# Patient Record
Sex: Male | Born: 1980 | Race: White | Hispanic: No | Marital: Married | Smoking: Current every day smoker
Health system: Southern US, Community
[De-identification: ages and names within clinical notes are randomized; demographics above are authoritative.]

## PROBLEM LIST (undated history)

## (undated) DIAGNOSIS — R112 Nausea with vomiting, unspecified: Secondary | ICD-10-CM

## (undated) DIAGNOSIS — J3081 Allergic rhinitis due to animal (cat) (dog) hair and dander: Secondary | ICD-10-CM

## (undated) DIAGNOSIS — T8859XA Other complications of anesthesia, initial encounter: Secondary | ICD-10-CM

## (undated) DIAGNOSIS — F419 Anxiety disorder, unspecified: Secondary | ICD-10-CM

## (undated) DIAGNOSIS — T4145XA Adverse effect of unspecified anesthetic, initial encounter: Secondary | ICD-10-CM

## (undated) DIAGNOSIS — K219 Gastro-esophageal reflux disease without esophagitis: Secondary | ICD-10-CM

## (undated) DIAGNOSIS — Z9889 Other specified postprocedural states: Secondary | ICD-10-CM

## (undated) HISTORY — PX: COLONOSCOPY: SHX174

## (undated) HISTORY — PX: HAND SURGERY: SHX662

---

## 2004-03-23 ENCOUNTER — Emergency Department (HOSPITAL_COMMUNITY): Admission: EM | Admit: 2004-03-23 | Discharge: 2004-03-23 | Payer: Self-pay | Admitting: Emergency Medicine

## 2004-03-24 ENCOUNTER — Emergency Department (HOSPITAL_COMMUNITY): Admission: EM | Admit: 2004-03-24 | Discharge: 2004-03-24 | Payer: Self-pay | Admitting: Emergency Medicine

## 2014-04-29 ENCOUNTER — Other Ambulatory Visit: Payer: Self-pay | Admitting: Gastroenterology

## 2014-04-29 DIAGNOSIS — R1011 Right upper quadrant pain: Secondary | ICD-10-CM

## 2014-04-29 DIAGNOSIS — R110 Nausea: Secondary | ICD-10-CM

## 2014-05-21 ENCOUNTER — Ambulatory Visit (HOSPITAL_COMMUNITY)
Admission: RE | Admit: 2014-05-21 | Discharge: 2014-05-21 | Disposition: A | Discharge status: Home / Self Care | Payer: BC Managed Care – PPO | Source: Ambulatory Visit | Attending: Gastroenterology | Admitting: Gastroenterology

## 2014-05-21 DIAGNOSIS — R1013 Epigastric pain: Secondary | ICD-10-CM | POA: Diagnosis present

## 2014-05-21 DIAGNOSIS — R161 Splenomegaly, not elsewhere classified: Secondary | ICD-10-CM | POA: Diagnosis not present

## 2014-05-21 DIAGNOSIS — R1011 Right upper quadrant pain: Secondary | ICD-10-CM

## 2014-05-21 DIAGNOSIS — R110 Nausea: Secondary | ICD-10-CM

## 2014-05-26 ENCOUNTER — Ambulatory Visit (HOSPITAL_COMMUNITY): Admission: RE | Admit: 2014-05-26 | Payer: BC Managed Care – PPO | Source: Ambulatory Visit

## 2014-06-01 ENCOUNTER — Ambulatory Visit (HOSPITAL_COMMUNITY)
Admission: RE | Admit: 2014-06-01 | Discharge: 2014-06-01 | Disposition: A | Discharge status: Home / Self Care | Payer: BC Managed Care – PPO | Source: Ambulatory Visit | Attending: Gastroenterology | Admitting: Gastroenterology

## 2014-06-01 DIAGNOSIS — R110 Nausea: Secondary | ICD-10-CM | POA: Diagnosis present

## 2014-06-01 DIAGNOSIS — R1011 Right upper quadrant pain: Secondary | ICD-10-CM | POA: Insufficient documentation

## 2014-06-01 MED ORDER — SINCALIDE 5 MCG IJ SOLR
INTRAMUSCULAR | Status: AC
Start: 2014-06-01 — End: 2014-06-02
  Filled 2014-06-01: qty 5

## 2014-06-01 MED ORDER — SINCALIDE 5 MCG IJ SOLR
0.0200 ug/kg | Freq: Once | INTRAMUSCULAR | Status: AC
  Administered 2014-06-01: 5 ug via INTRAVENOUS

## 2014-06-01 MED ORDER — STERILE WATER FOR INJECTION IJ SOLN
INTRAMUSCULAR | Status: AC
  Filled 2014-06-01: qty 10

## 2014-06-01 MED ORDER — TECHNETIUM TC 99M MEBROFENIN IV KIT
5.0000 | PACK | Freq: Once | INTRAVENOUS | Status: AC | PRN
Start: 2014-06-01 — End: 2014-06-01

## 2014-06-08 ENCOUNTER — Other Ambulatory Visit: Payer: Self-pay | Admitting: Gastroenterology

## 2014-06-08 DIAGNOSIS — R1084 Generalized abdominal pain: Secondary | ICD-10-CM

## 2014-06-10 ENCOUNTER — Ambulatory Visit
Admission: RE | Admit: 2014-06-10 | Discharge: 2014-06-10 | Disposition: A | Discharge status: Home / Self Care | Payer: BC Managed Care – PPO | Source: Ambulatory Visit | Attending: Gastroenterology | Admitting: Gastroenterology

## 2014-06-10 DIAGNOSIS — R1084 Generalized abdominal pain: Secondary | ICD-10-CM

## 2014-06-10 MED ORDER — IOHEXOL 300 MG/ML  SOLN
100.0000 mL | Freq: Once | INTRAMUSCULAR | Status: AC | PRN
  Administered 2014-06-10: 100 mL via INTRAVENOUS

## 2014-06-22 ENCOUNTER — Ambulatory Visit (INDEPENDENT_AMBULATORY_CARE_PROVIDER_SITE_OTHER): Payer: Self-pay | Admitting: General Surgery

## 2014-06-24 ENCOUNTER — Encounter (HOSPITAL_COMMUNITY): Payer: Self-pay | Admitting: *Deleted

## 2014-06-24 NOTE — Progress Notes (Addendum)
Mr Earl Wells reports has "chest pain" .  Chest pain is on the right side, just approx 1 inch found nipple line.  Patient also reports left shoulder pain, states he has been having this pain for about 2 months.  Mr Earl Wells also reports that he has had nausea and vomiting since Spring, "yellow with ? stones, little seeds".  Patient reports some shortness of breathe if pain is severe.  Pain is "like a pressure."Mr Earl Wells states normally pain begins after eating, but also has first thing in am. "Sometimes poain is relieved with Gas X and Xanax." Mr Earl Wells has seen his PCP, Dr Earl Wells, Dr Earl Wells and Dr Earl Wells. Patient has a normal HIDA scan.  Mr Earl Wells does not have any cardiac history, no HTN.  PGF has a MI in his 5070's and had cardiac stents, he died later from cancer.  Both parents have HTN. I am leaving chart for follow up with Earl CaprioAllison Z.

## 2014-06-28 MED ORDER — CIPROFLOXACIN IN D5W 400 MG/200ML IV SOLN
400.0000 mg | INTRAVENOUS | Status: AC
  Administered 2014-06-29: 400 mg via INTRAVENOUS
  Filled 2014-06-28: qty 200

## 2014-06-28 NOTE — Progress Notes (Signed)
Anesthesia Chart Review:  SAME DAY WORK-UP.  Patient is a 33 year old male scheduled for laparoscopic cholecystectomy tomorrow by Dr. Lindie SpruceWyatt.  Other history includes smoking, anxiety, GERD, post-operative N/V.  Family history of CAD in PGF in his 3270's. Weight not documented yet. PCP is Dr. Marjory LiesBrent Burnett.  He told his PAT phone interviewing RN that he had chest pain around his right nipple.    Meds include Xanax, albuterol, Dexilant.   He is a same day work-up, so plan further evaluation by his anesthesiologist and pre-operative EKG tomorrow on arrival.  Velna Ochsllison Nakul Avino, PA-C [facility] Phone [phone] 06/28/2014 10:25 AM

## 2014-06-29 ENCOUNTER — Ambulatory Visit (HOSPITAL_COMMUNITY)
Admission: RE | Admit: 2014-06-29 | Discharge: 2014-06-29 | Disposition: A | Discharge status: Home / Self Care | Payer: BC Managed Care – PPO | Source: Ambulatory Visit | Attending: General Surgery | Admitting: General Surgery

## 2014-06-29 ENCOUNTER — Ambulatory Visit (HOSPITAL_COMMUNITY): Payer: BC Managed Care – PPO | Admitting: Vascular Surgery

## 2014-06-29 ENCOUNTER — Encounter (HOSPITAL_COMMUNITY)
Admission: RE | Disposition: A | Discharge status: Home / Self Care | Payer: Self-pay | Source: Ambulatory Visit | Attending: General Surgery

## 2014-06-29 ENCOUNTER — Encounter (HOSPITAL_COMMUNITY): Payer: Self-pay | Admitting: Anesthesiology

## 2014-06-29 DIAGNOSIS — R001 Bradycardia, unspecified: Secondary | ICD-10-CM | POA: Insufficient documentation

## 2014-06-29 DIAGNOSIS — K219 Gastro-esophageal reflux disease without esophagitis: Secondary | ICD-10-CM | POA: Insufficient documentation

## 2014-06-29 DIAGNOSIS — K811 Chronic cholecystitis: Secondary | ICD-10-CM | POA: Diagnosis not present

## 2014-06-29 DIAGNOSIS — Z79899 Other long term (current) drug therapy: Secondary | ICD-10-CM | POA: Insufficient documentation

## 2014-06-29 DIAGNOSIS — F17200 Nicotine dependence, unspecified, uncomplicated: Secondary | ICD-10-CM | POA: Diagnosis not present

## 2014-06-29 DIAGNOSIS — K829 Disease of gallbladder, unspecified: Secondary | ICD-10-CM | POA: Diagnosis present

## 2014-06-29 HISTORY — DX: Anxiety disorder, unspecified: F41.9

## 2014-06-29 HISTORY — DX: Gastro-esophageal reflux disease without esophagitis: K21.9

## 2014-06-29 HISTORY — DX: Other complications of anesthesia, initial encounter: T88.59XA

## 2014-06-29 HISTORY — DX: Allergic rhinitis due to animal (cat) (dog) hair and dander: J30.81

## 2014-06-29 HISTORY — PX: CHOLECYSTECTOMY: SHX55

## 2014-06-29 HISTORY — DX: Adverse effect of unspecified anesthetic, initial encounter: T41.45XA

## 2014-06-29 HISTORY — DX: Other specified postprocedural states: Z98.890

## 2014-06-29 HISTORY — DX: Nausea with vomiting, unspecified: R11.2

## 2014-06-29 LAB — COMPREHENSIVE METABOLIC PANEL
ALBUMIN: 4.4 g/dL (ref 3.5–5.2)
ALT: 24 U/L (ref 0–53)
AST: 29 U/L (ref 0–37)
Alkaline Phosphatase: 72 U/L (ref 39–117)
Anion gap: 8 (ref 5–15)
BUN: 9 mg/dL (ref 6–23)
CO2: 27 mmol/L (ref 19–32)
Calcium: 9 mg/dL (ref 8.4–10.5)
Chloride: 105 mEq/L (ref 96–112)
Creatinine, Ser: 0.89 mg/dL (ref 0.50–1.35)
GFR calc Af Amer: >90 mL/min (ref >90)
GFR calc non Af Amer: >90 mL/min (ref >90)
Glucose, Bld: 106 mg/dL — ABNORMAL HIGH (ref 70–99)
POTASSIUM: 4 mmol/L (ref 3.5–5.1)
Sodium: 140 mmol/L (ref 135–145)
Total Bilirubin: 0.9 mg/dL (ref 0.3–1.2)
Total Protein: 6.6 g/dL (ref 6.0–8.3)

## 2014-06-29 LAB — CBC WITH DIFFERENTIAL/PLATELET
BASOS PCT: 0 % (ref 0–1)
Basophils Absolute: 0 10*3/uL (ref 0.0–0.1)
Eosinophils Absolute: 0.2 10*3/uL (ref 0.0–0.7)
Eosinophils Relative: 2 % (ref 0–5)
HCT: 45.5 % (ref 39.0–52.0)
HEMOGLOBIN: 15.6 g/dL (ref 13.0–17.0)
Lymphocytes Relative: 38 % (ref 12–46)
Lymphs Abs: 3.4 10*3/uL (ref 0.7–4.0)
MCH: 28.7 pg (ref 26.0–34.0)
MCHC: 34.3 g/dL (ref 30.0–36.0)
MCV: 83.8 fL (ref 78.0–100.0)
Monocytes Absolute: 0.5 10*3/uL (ref 0.1–1.0)
Monocytes Relative: 5 % (ref 3–12)
NEUTROS ABS: 4.9 10*3/uL (ref 1.7–7.7)
NEUTROS PCT: 55 % (ref 43–77)
Platelets: 186 10*3/uL (ref 150–400)
RBC: 5.43 MIL/uL (ref 4.22–5.81)
RDW: 13.1 % (ref 11.5–15.5)
WBC: 9 10*3/uL (ref 4.0–10.5)

## 2014-06-29 SURGERY — LAPAROSCOPIC CHOLECYSTECTOMY WITH INTRAOPERATIVE CHOLANGIOGRAM
Anesthesia: General | Site: Abdomen

## 2014-06-29 MED ORDER — NEOSTIGMINE METHYLSULFATE 10 MG/10ML IV SOLN
INTRAVENOUS | Status: DC | PRN
  Administered 2014-06-29: 3 mg via INTRAVENOUS

## 2014-06-29 MED ORDER — DEXAMETHASONE SODIUM PHOSPHATE 4 MG/ML IJ SOLN
INTRAMUSCULAR | Status: DC | PRN
  Administered 2014-06-29: 8 mg via INTRAVENOUS

## 2014-06-29 MED ORDER — ONDANSETRON HCL 4 MG/2ML IJ SOLN
INTRAMUSCULAR | Status: AC
  Filled 2014-06-29: qty 2

## 2014-06-29 MED ORDER — LACTATED RINGERS IV SOLN
INTRAVENOUS | Status: DC
  Administered 2014-06-29 (×2): via INTRAVENOUS

## 2014-06-29 MED ORDER — VECURONIUM BROMIDE 10 MG IV SOLR
INTRAVENOUS | Status: AC
  Filled 2014-06-29: qty 20

## 2014-06-29 MED ORDER — GLYCOPYRROLATE 0.2 MG/ML IJ SOLN
INTRAMUSCULAR | Status: AC
  Filled 2014-06-29: qty 2

## 2014-06-29 MED ORDER — HYDROCODONE-ACETAMINOPHEN 10-325 MG PO TABS: 1.0000 | ORAL_TABLET | Freq: Four times a day (QID) | ORAL | Status: AC | PRN

## 2014-06-29 MED ORDER — FENTANYL CITRATE 0.05 MG/ML IJ SOLN
INTRAMUSCULAR | Status: DC | PRN
  Administered 2014-06-29 (×2): 50 ug via INTRAVENOUS
  Administered 2014-06-29: 100 ug via INTRAVENOUS

## 2014-06-29 MED ORDER — 0.9 % SODIUM CHLORIDE (POUR BTL) OPTIME
TOPICAL | Status: DC | PRN
  Administered 2014-06-29: 1000 mL

## 2014-06-29 MED ORDER — CHLORHEXIDINE GLUCONATE 4 % EX LIQD: 1.0000 "application " | Freq: Once | CUTANEOUS | Status: DC

## 2014-06-29 MED ORDER — MIDAZOLAM HCL 2 MG/2ML IJ SOLN
INTRAMUSCULAR | Status: AC
  Filled 2014-06-29: qty 2

## 2014-06-29 MED ORDER — VECURONIUM BROMIDE 10 MG IV SOLR
INTRAVENOUS | Status: AC
  Filled 2014-06-29: qty 10

## 2014-06-29 MED ORDER — NEOSTIGMINE METHYLSULFATE 10 MG/10ML IV SOLN
INTRAVENOUS | Status: AC
  Filled 2014-06-29: qty 1

## 2014-06-29 MED ORDER — GLYCOPYRROLATE 0.2 MG/ML IJ SOLN
INTRAMUSCULAR | Status: DC | PRN
  Administered 2014-06-29: 0.2 mg via INTRAVENOUS
  Administered 2014-06-29: 0.4 mg via INTRAVENOUS
  Administered 2014-06-29: 0.2 mg via INTRAVENOUS

## 2014-06-29 MED ORDER — BUPIVACAINE-EPINEPHRINE 0.25% -1:200000 IJ SOLN
INTRAMUSCULAR | Status: DC | PRN
  Administered 2014-06-29: 15 mL

## 2014-06-29 MED ORDER — SODIUM CHLORIDE 0.9 % IJ SOLN
INTRAMUSCULAR | Status: AC
  Filled 2014-06-29: qty 10

## 2014-06-29 MED ORDER — SCOPOLAMINE 1 MG/3DAYS TD PT72
1.0000 | MEDICATED_PATCH | TRANSDERMAL | Status: DC
  Administered 2014-06-29: 1.5 mg via TRANSDERMAL
  Filled 2014-06-29: qty 1

## 2014-06-29 MED ORDER — BUPIVACAINE-EPINEPHRINE (PF) 0.25% -1:200000 IJ SOLN
INTRAMUSCULAR | Status: AC
  Filled 2014-06-29: qty 30

## 2014-06-29 MED ORDER — ROCURONIUM BROMIDE 50 MG/5ML IV SOLN
INTRAVENOUS | Status: AC
  Filled 2014-06-29: qty 1

## 2014-06-29 MED ORDER — PROPOFOL 10 MG/ML IV BOLUS
INTRAVENOUS | Status: DC | PRN
  Administered 2014-06-29: 170 mg via INTRAVENOUS

## 2014-06-29 MED ORDER — LIDOCAINE HCL (CARDIAC) 20 MG/ML IV SOLN
INTRAVENOUS | Status: AC
  Filled 2014-06-29: qty 5

## 2014-06-29 MED ORDER — FENTANYL CITRATE 0.05 MG/ML IJ SOLN
INTRAMUSCULAR | Status: AC
  Administered 2014-06-29: 25 ug via INTRAVENOUS
  Filled 2014-06-29: qty 2

## 2014-06-29 MED ORDER — MEPERIDINE HCL 25 MG/ML IJ SOLN: 6.2500 mg | INTRAMUSCULAR | Status: DC | PRN

## 2014-06-29 MED ORDER — SODIUM CHLORIDE 0.9 % IR SOLN
Status: DC | PRN
  Administered 2014-06-29: 1

## 2014-06-29 MED ORDER — FENTANYL CITRATE 0.05 MG/ML IJ SOLN
25.0000 ug | INTRAMUSCULAR | Status: DC | PRN
  Administered 2014-06-29: 25 ug via INTRAVENOUS
  Administered 2014-06-29: 50 ug via INTRAVENOUS
  Administered 2014-06-29: 25 ug via INTRAVENOUS

## 2014-06-29 MED ORDER — STERILE WATER FOR INJECTION IJ SOLN
INTRAMUSCULAR | Status: AC
  Filled 2014-06-29: qty 20

## 2014-06-29 MED ORDER — ROCURONIUM BROMIDE 100 MG/10ML IV SOLN
INTRAVENOUS | Status: DC | PRN
  Administered 2014-06-29: 40 mg via INTRAVENOUS

## 2014-06-29 MED ORDER — GLYCOPYRROLATE 0.2 MG/ML IJ SOLN
INTRAMUSCULAR | Status: AC
  Filled 2014-06-29: qty 1

## 2014-06-29 MED ORDER — STERILE WATER FOR INJECTION IJ SOLN
INTRAMUSCULAR | Status: AC
  Filled 2014-06-29: qty 10

## 2014-06-29 MED ORDER — MIDAZOLAM HCL 5 MG/5ML IJ SOLN
INTRAMUSCULAR | Status: DC | PRN
  Administered 2014-06-29: 2 mg via INTRAVENOUS

## 2014-06-29 MED ORDER — LIDOCAINE HCL (CARDIAC) 20 MG/ML IV SOLN
INTRAVENOUS | Status: DC | PRN
  Administered 2014-06-29: 100 mg via INTRAVENOUS

## 2014-06-29 MED ORDER — FENTANYL CITRATE 0.05 MG/ML IJ SOLN
INTRAMUSCULAR | Status: AC
  Filled 2014-06-29: qty 5

## 2014-06-29 MED ORDER — PROPOFOL 10 MG/ML IV BOLUS
INTRAVENOUS | Status: AC
  Filled 2014-06-29: qty 20

## 2014-06-29 MED ORDER — ONDANSETRON HCL 4 MG/2ML IJ SOLN
INTRAMUSCULAR | Status: DC | PRN
  Administered 2014-06-29: 4 mg via INTRAVENOUS

## 2014-06-29 MED ORDER — PROMETHAZINE HCL 25 MG/ML IJ SOLN: 6.2500 mg | INTRAMUSCULAR | Status: DC | PRN

## 2014-06-29 MED ORDER — EPHEDRINE SULFATE 50 MG/ML IJ SOLN
INTRAMUSCULAR | Status: AC
  Filled 2014-06-29: qty 1

## 2014-06-29 SURGICAL SUPPLY — 41 items
APPLIER CLIP 5 13 M/L LIGAMAX5 (MISCELLANEOUS)
APPLIER CLIP ROT 10 11.4 M/L (STAPLE)
APR CLP MED LRG 11.4X10 (STAPLE)
APR CLP MED LRG 5 ANG JAW (MISCELLANEOUS)
BAG SPEC RTRVL LRG 6X4 10 (ENDOMECHANICALS)
BLADE SURG ROTATE 9660 (MISCELLANEOUS) IMPLANT
CANISTER SUCTION 2500CC (MISCELLANEOUS) ×2 IMPLANT
CHLORAPREP W/TINT 26ML (MISCELLANEOUS) ×2 IMPLANT
CLIP APPLIE 5 13 M/L LIGAMAX5 (MISCELLANEOUS) IMPLANT
CLIP APPLIE ROT 10 11.4 M/L (STAPLE) IMPLANT
COVER MAYO STAND STRL (DRAPES) ×2 IMPLANT
COVER SURGICAL LIGHT HANDLE (MISCELLANEOUS) ×2 IMPLANT
DRAPE C-ARM 42X72 X-RAY (DRAPES) ×2 IMPLANT
DRAPE LAPAROSCOPIC ABDOMINAL (DRAPES) ×2 IMPLANT
DRSG TEGADERM 2-3/8X2-3/4 SM (GAUZE/BANDAGES/DRESSINGS) ×8 IMPLANT
ELECT REM PT RETURN 9FT ADLT (ELECTROSURGICAL) ×2
ELECTRODE REM PT RTRN 9FT ADLT (ELECTROSURGICAL) ×1 IMPLANT
GLOVE BIOGEL PI IND STRL 8 (GLOVE) ×1 IMPLANT
GLOVE BIOGEL PI INDICATOR 8 (GLOVE) ×1
GLOVE ECLIPSE 7.5 STRL STRAW (GLOVE) ×2 IMPLANT
GOWN STRL REUS W/ TWL LRG LVL3 (GOWN DISPOSABLE) ×3 IMPLANT
GOWN STRL REUS W/TWL LRG LVL3 (GOWN DISPOSABLE) ×6
KIT BASIN OR (CUSTOM PROCEDURE TRAY) ×2 IMPLANT
KIT ROOM TURNOVER OR (KITS) ×2 IMPLANT
LIQUID BAND (GAUZE/BANDAGES/DRESSINGS) ×2 IMPLANT
NS IRRIG 1000ML POUR BTL (IV SOLUTION) ×2 IMPLANT
PAD ARMBOARD 7.5X6 YLW CONV (MISCELLANEOUS) ×2 IMPLANT
POUCH SPECIMEN RETRIEVAL 10MM (ENDOMECHANICALS) IMPLANT
SCISSORS LAP 5X35 DISP (ENDOMECHANICALS) ×2 IMPLANT
SET CHOLANGIOGRAPH 5 50 .035 (SET/KITS/TRAYS/PACK) ×2 IMPLANT
SET IRRIG TUBING LAPAROSCOPIC (IRRIGATION / IRRIGATOR) ×2 IMPLANT
SLEEVE ENDOPATH XCEL 5M (ENDOMECHANICALS) ×2 IMPLANT
SPECIMEN JAR SMALL (MISCELLANEOUS) ×2 IMPLANT
SUT MNCRL AB 4-0 PS2 18 (SUTURE) ×2 IMPLANT
TOWEL OR 17X24 6PK STRL BLUE (TOWEL DISPOSABLE) ×2 IMPLANT
TOWEL OR 17X26 10 PK STRL BLUE (TOWEL DISPOSABLE) ×2 IMPLANT
TRAY LAPAROSCOPIC (CUSTOM PROCEDURE TRAY) ×2 IMPLANT
TROCAR XCEL BLUNT TIP 100MML (ENDOMECHANICALS) ×2 IMPLANT
TROCAR XCEL NON-BLD 11X100MML (ENDOMECHANICALS) IMPLANT
TROCAR XCEL NON-BLD 5MMX100MML (ENDOMECHANICALS) ×2 IMPLANT
TUBING INSUFFLATION (TUBING) ×2 IMPLANT

## 2014-06-29 NOTE — Discharge Instructions (Addendum)
Laparoscopic Cholecystectomy, Care After Refer to this sheet in the next few weeks. These instructions provide you with information on caring for yourself after your procedure. Your health care provider may also give you more specific instructions. Your treatment has been planned according to current medical practices, but problems sometimes occur. Call your health care provider if you have any problems or questions after your procedure. WHAT TO EXPECT AFTER THE PROCEDURE After your procedure, it is typical to have the following:  Pain at your incision sites. You will be given pain medicines to control the pain.  Mild nausea or vomiting. This should improve after the first 24 hours.  Bloating and possibly shoulder pain from the gas used during the procedure. This will improve after the first 24 hours. HOME CARE INSTRUCTIONS   Change bandages (dressings) as directed by your health care provider.  Keep the wound dry and clean. You may wash the wound gently with soap and water. Gently blot or dab the area dry.  Do not take baths or use swimming pools or hot tubs for 2 weeks or until your health care provider approves.  Only take over-the-counter or prescription medicines as directed by your health care provider.  Continue your normal diet as directed by your health care provider.  Do not lift anything heavier than 10 pounds (4.5 kg) until your health care provider approves.  Do not play contact sports for 1 week or until your health care provider approves.  Leave plastic dressings intact until see in clinic SEEK MEDICAL CARE IF:   You have redness, swelling, or increasing pain in the wound.  You notice yellowish-white fluid (pus) coming from the wound.  You have drainage from the wound that lasts longer than 1 day.  You notice a bad smell coming from the wound or dressing.  Your surgical cuts (incisions) break open. SEEK IMMEDIATE MEDICAL CARE IF:   You develop a rash.  You  have difficulty breathing.  You have chest pain.  You have a fever.  You have increasing pain in the shoulders (shoulder strap areas).  You have dizzy episodes or faint while standing.  You have severe abdominal pain.  You feel sick to your stomach (nauseous) or throw up (vomit) and this lasts for more than 1 day. Document Released: 06/18/2005 Document Revised: 04/08/2013 Document Reviewed: 01/28/2013 [facility] Patient Information 2015 [place], [place]. This information is not intended to replace advice given to you by your health care provider. Make sure you discuss any questions you have with your health care provider.   What to eat:  For your first meals, you should eat lightly; only small meals initially.  If you do not have nausea, you may eat larger meals.  Avoid spicy, greasy and heavy food.    General Anesthesia, Adult, Care After  Refer to this sheet in the next few weeks. These instructions provide you with information on caring for yourself after your procedure. Your health care provider may also give you more specific instructions. Your treatment has been planned according to current medical practices, but problems sometimes occur. Call your health care provider if you have any problems or questions after your procedure.  WHAT TO EXPECT AFTER THE PROCEDURE  After the procedure, it is typical to experience:  Sleepiness.  Nausea and vomiting. HOME CARE INSTRUCTIONS  For the first 24 hours after general anesthesia:  Have a responsible person with you.  Do not drive a car. If you are alone, do not take public transportation.  Do not drink alcohol.  Do not take medicine that has not been prescribed by your health care provider.  Do not sign important papers or make important decisions.  You may resume a normal diet and activities as directed by your health care provider.  Change bandages (dressings) as directed.  If you have questions or problems that seem related to general  anesthesia, call the hospital and ask for the anesthetist or anesthesiologist on call. SEEK MEDICAL CARE IF:  You have nausea and vomiting that continue the day after anesthesia.  You develop a rash. SEEK IMMEDIATE MEDICAL CARE IF:  You have difficulty breathing.  You have chest pain.  You have any allergic problems. Document Released: 09/24/2000 Document Revised: 02/18/2013 Document Reviewed: 01/01/2013  [facility] Patient Information 2014 [place], [place].

## 2014-06-29 NOTE — Addendum Note (Signed)
Addendum  created 06/29/14 1801 by Shireen Quanavid R Riaz Onorato, CRNA   Modules edited: Anesthesia Events, Narrator   Narrator:  Narrator: Event Log Edited

## 2014-06-29 NOTE — Anesthesia Procedure Notes (Signed)
Procedure Name: Intubation Date/Time: 06/29/2014 1:48 PM Performed by: Jerilee HohMUMM, Yeng Frankie N Pre-anesthesia Checklist: Patient identified, Emergency Drugs available, Patient being monitored and Suction available Patient Re-evaluated:Patient Re-evaluated prior to inductionOxygen Delivery Method: Circle system utilized Preoxygenation: Pre-oxygenation with 100% oxygen Intubation Type: IV induction Ventilation: Mask ventilation without difficulty Laryngoscope Size: Mac and 4 Grade View: Grade I Tube type: Oral Tube size: 7.5 mm Number of attempts: 1 Airway Equipment and Method: Stylet Placement Confirmation: ETT inserted through vocal cords under direct vision,  positive ETCO2 and breath sounds checked- equal and bilateral Secured at: 22 cm Tube secured with: Tape Dental Injury: Teeth and Oropharynx as per pre-operative assessment

## 2014-06-29 NOTE — Anesthesia Postprocedure Evaluation (Signed)
  Anesthesia Post-op Note  Patient: Earl Wells  Procedure(s) Performed: Procedure(s): LAPAROSCOPIC CHOLECYSTECTOMY  (N/A)  Patient Location: PACU  Anesthesia Type:General  Level of Consciousness: awake and alert   Airway and Oxygen Therapy: Patient Spontanous Breathing  Post-op Pain: mild  Post-op Assessment: Post-op Vital signs reviewed, Patient's Cardiovascular Status Stable, Respiratory Function Stable, Patent Airway, No signs of Nausea or vomiting and Pain level controlled  Post-op Vital Signs: Reviewed and stable  Last Vitals:  Filed Vitals:   06/29/14 1615  BP:   Pulse: 45  Temp:   Resp:     Complications: No apparent anesthesia complications

## 2014-06-29 NOTE — H&P (Signed)
Earl Wells 06/22/2014 2:10 PM Location: [facility] Patient #: [id number] DOB: 04/24/1981 Married / Language: English / Race: White Male  History of Present Illness Earl Wells(Adelise Buswell O. Lindie SpruceWyatt MD; 06/22/2014 2:48 PM) Patient words: GB pain.  The patient is a 33 year old male who presents with non-malignant abdominal pain. Symptoms include abdominal pain, diarrhea, nausea and vomiting. The patient describes the pain as sharp, aching and shooting. Onset was 60 month(s) ago. Pain exacerbations occur 3 times a day.   Other Problems Earl Wells(Michele Daniels, CMA; 06/22/2014 2:10 PM) Gastroesophageal Reflux Disease Ulcerative Colitis  Past Surgical History Earl Wells(Michele Daniels, CMA; 06/22/2014 2:10 PM) Colon Polyp Removal - Colonoscopy  Diagnostic Studies History Earl Wells(Michele Daniels, CMA; 06/22/2014 2:10 PM) Colonoscopy within last year  Allergies Earl Wells(Michele Daniels, CMA; 06/22/2014 2:11 PM) No Known Drug Allergies12/22/2015  Medication History Earl Wells(Michele Daniels, CMA; 06/22/2014 2:13 PM) Dexilant (60MG  Capsule DR, Oral) Active. Xanax (0.5MG  Tablet, Oral) Active.  Social History Earl Wells(Michele Daniels, [place]; 06/22/2014 2:10 PM) Alcohol use Remotely quit alcohol use. Caffeine use Carbonated beverages, Tea. Tobacco use Current some day smoker.  Family History Earl Wells(Michele Daniels, CMA; 06/22/2014 2:10 PM) Colon Polyps Father, Mother. Diabetes Mellitus Father. Hypertension Father, Mother.  Review of Systems Earl Wells(Townes Fuhs O. Lindie SpruceWyatt MD; 06/22/2014 2:45 PM) General Not Present- Appetite Loss, Chills, Fatigue, Fever, Night Sweats, Weight Gain and Weight Loss. Skin Not Present- Change in Wart/Mole, Dryness, Hives, Jaundice, New Lesions, Non-Healing Wounds, Rash and Ulcer. HEENT Not Present- Earache, Hearing Loss, Hoarseness, Nose Bleed, Oral Ulcers, Ringing in the Ears, Seasonal Allergies, Sinus Pain, Sore Throat, Visual Disturbances, Wears glasses/contact lenses and Yellow Eyes. Respiratory Present-  Wheezing. Not Present- Bloody sputum, Chronic Cough, Difficulty Breathing and Snoring. Breast Not Present- Breast Mass, Breast Pain, Nipple Discharge and Skin Changes. Cardiovascular Present- Chest Pain. Not Present- Difficulty Breathing Lying Down, Leg Cramps, Palpitations, Rapid Heart Rate, Shortness of Breath and Swelling of Extremities. Gastrointestinal Present- Abdominal Pain, Bloating, Change in Bowel Habits, Chronic diarrhea, Excessive gas, Gets full quickly at meals, Heartburn and Vomiting. Not Present- Bloody Stool, Constipation, Difficulty Swallowing, Hemorrhoids, Indigestion, Nausea and Rectal Pain. Male Genitourinary Not Present- Blood in Urine, Change in Urinary Stream, Frequency, Impotence, Nocturia, Painful Urination, Urgency and Urine Leakage.   Vitals Earl Wells(Michele Daniels CMA; 06/22/2014 2:14 PM) 06/22/2014 2:13 PM Weight: 166.5 lb Height: 69in Body Surface Area: 1.92 m Body Mass Index: 24.59 kg/m Temp.: 98.18F  Pulse: 76 (Regular)  BP: 140/68 (Sitting, Left Arm, Standard)    Physical Exam (Rayan Ines O. Lindie SpruceWyatt MD; 06/22/2014 2:52 PM) General Mental Status-Alert. General Appearance-Cooperative. Build & Nutrition-Muscular. Posture-Normal posture.  Chest and Lung Exam Chest and lung exam reveals -normal excursion with symmetric chest walls. Auscultation Breath sounds - Normal and Clear.  Cardiovascular Auscultation Rhythm - Regular. Heart Sounds - Normal heart sounds. Murmurs & Other Heart Sounds - Auscultation of the heart reveals - No Murmurs.  Abdomen Inspection Inspection of the abdomen reveals - No Visible peristalsis and No Hernias. Palpation/Percussion Tenderness - Left Upper Quadrant and Right Upper Quadrant. Auscultation Auscultation of the abdomen reveals - Bowel sounds normal.  Neurologic Neurologic evaluation reveals -alert and oriented x 3 with no impairment of recent or remote memory, normal attention span and ability to  concentrate and upper and lower extremity deep tendon reflexes intact bilaterally (Normal).  Lymphatic Note: Normal cervical and axillary and inguinal palpation     Assessment & Plan Fayrene Fearing(Tam Savoia O. 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

## 2014-06-29 NOTE — Anesthesia Preprocedure Evaluation (Signed)
Anesthesia Evaluation  Patient identified by MRN, date of birth, ID band Patient awake    Reviewed: Allergy & Precautions, H&P , NPO status , Patient's Chart, lab work & pertinent test results, reviewed documented beta blocker date and time   History of Anesthesia Complications (+) PONV  Airway        Dental   Pulmonary Current Smoker (15 pack year),          Cardiovascular     Neuro/Psych Anxiety    GI/Hepatic GERD-  ,  Endo/Other    Renal/GU      Musculoskeletal   Abdominal   Peds  Hematology   Anesthesia Other Findings   Reproductive/Obstetrics                             Anesthesia Physical Anesthesia Plan  ASA: II  Anesthesia Plan: General   Post-op Pain Management:    Induction: Intravenous  Airway Management Planned: Oral ETT  Additional Equipment:   Intra-op Plan:   Post-operative Plan: Extubation in OR  Informed Consent: I have reviewed the patients History and Physical, chart, labs and discussed the procedure including the risks, benefits and alternatives for the proposed anesthesia with the patient or authorized representative who has indicated his/her understanding and acceptance.     Plan Discussed with:   Anesthesia Plan Comments: (Need to check this am labs ordered)        Anesthesia Quick Evaluation

## 2014-06-29 NOTE — Transfer of Care (Signed)
Immediate Anesthesia Transfer of Care Note  Patient: Earl Wells  Procedure(s) Performed: Procedure(s): LAPAROSCOPIC CHOLECYSTECTOMY  (N/A)  Patient Location: PACU  Anesthesia Type:General  Level of Consciousness: awake, alert , oriented and patient cooperative  Airway & Oxygen Therapy: Patient Spontanous Breathing and Patient connected to nasal cannula oxygen  Post-op Assessment: Report given to PACU RN, Post -op Vital signs reviewed and stable and Patient moving all extremities  Post vital signs: Reviewed and stable  Complications: No apparent anesthesia complications

## 2014-06-29 NOTE — Op Note (Signed)
OPERATIVE REPORT  DATE OF OPERATION: 06/29/2014  PATIENT:  Earl Wells  33 y.o. male  PRE-OPERATIVE DIAGNOSIS:  symptomatic gallbadder sludge  POST-OPERATIVE DIAGNOSIS:  symptomatic gallbadder sludge  PROCEDURE:  Procedure(s): LAPAROSCOPIC CHOLECYSTECTOMY   SURGEON:  Surgeon(s): Frederik SchmidtJay Yurika Pereda, MD  ASSISTANT: None  ANESTHESIA:   general  EBL: <20 ml  BLOOD ADMINISTERED: none  DRAINS: none   SPECIMEN:  Source of Specimen:  Gallbladder and contents  COUNTS CORRECT:  YES  PROCEDURE DETAILS: The patient was taken to the operating room and placed on the table in the supine position.  After an adequate endotracheal anesthetic was administered, the patient was prepped with ChloroPrep, and then draped in the usual manner exposing the entire abdomen laterally, inferiorly and up  to the costal margins.  After a proper timeout was performed including identifying the patient and the procedure to be performed, a supraumbilical 1.5cm midline incision was made using a #15 blade.  This was taken down to the fascia which was then incised with a #15 blade.  The edges of the fascia were tented up with Kocher clamps as the preperitoneal space was penetrated with a Kelly clamp into the peritoneum.  Once this was done, a pursestring suture of 0 Vicryl was passed around the fascial opening.  This was subsequently used to secure the [facility] cannula which was passed into the peritoneal cavity.  Once the [facility] cannula was in place, carbon dioxide gas was insufflated into the peritoneal cavity up to a maximal intra-abdominal pressure of 15mm Hg.The laparoscope, with attached camera and light source, was passed into the peritoneal cavity to visualize the direct insertion of two right upper quadrant 5mm cannulas, and a sup-xiphoid 5mm cannula.  Once all cannulas were in place, the dissection was begun.  Two ratcheted graspers were attached to the dome and infundibulum of the gallbladder and retracted towards  the anterior abdominal wall and the right upper quadrant.  Using cautery attached to a dissecting forceps, the peritoneum overlaying the triangle of Chalot and the hepatoduodenal triangle was dissected away exposing the cystic duct and the cystic artery.  The cystic artery was clipped proximally and distally then transected.  A clip was placed on the gallbladder side of the cystic duct, then the distal cystic duct was clipped multiple times then transected.  The gallbladder was then dissected out of the hepatic bed without event.  It was retrieved from the abdomen (using an EndoCatch bag) without event.  Once the gallbladder was removed, the bed was inspected for hemostasis.  Once excellent hemostasis was obtained all gas and fluids were aspirated from above the liver, then the cannulas were removed.  The supraumbilical incision was closed using the pursestring suture which was in place.  0.25% bupivicaine with epinephrine was injected at all sites.  All 10mm or greater cannula sites were close using a running subcuticular stitch of 4-0 Monocryl.  5.750mm cannula sites were closed with Dermabond only.Steri-Strips and Tagaderm were used to complete the dressings at all sites.  At this point all needle, sponge, and instrument counts were correct.The patient was awakened from anesthesia and taken to the PACU in stable condition.  PATIENT DISPOSITION:  PACU - hemodynamically stable.   [name], JAY 12/29/20152:40 PM

## 2014-06-29 NOTE — Interval H&P Note (Signed)
History and Physical Interval Note: Patient has not been very symptomatic since his visit.  Although he has normal HIDA, sludge on US and symptoms suggest chronic disease. Marta LamasJames O. Gae BonWyatt, III, MD, FACS [phone]--pager [phone]--office [facility] 06/29/2014 1:01 PM  Jacki Conesaniel L Eades  has presented today for surgery, with the diagnosis of symptomatic gallbadder sludge  The various methods of treatment have been discussed with the patient and family. After consideration of risks, benefits and other options for treatment, the patient has consented to  Procedure(s): LAPAROSCOPIC CHOLECYSTECTOMY WITH INTRAOPERATIVE CHOLANGIOGRAM (N/A) as a surgical intervention .  The patient's history has been reviewed, patient examined, no change in status, stable for surgery.  I have reviewed the patient's chart and labs.  Questions were answered to the patient's satisfaction.     Manvir Prabhu, JAY

## 2014-06-30 ENCOUNTER — Encounter (HOSPITAL_COMMUNITY): Payer: Self-pay | Admitting: General Surgery

## 2014-10-28 ENCOUNTER — Telehealth: Payer: Self-pay | Admitting: Family Medicine

## 2014-10-28 NOTE — Telephone Encounter (Signed)
error:[id number] ° °

## 2015-01-07 ENCOUNTER — Other Ambulatory Visit: Payer: Self-pay | Admitting: Gastroenterology

## 2015-01-07 DIAGNOSIS — R1111 Vomiting without nausea: Secondary | ICD-10-CM

## 2015-01-17 ENCOUNTER — Ambulatory Visit (HOSPITAL_COMMUNITY)
Admission: RE | Admit: 2015-01-17 | Discharge: 2015-01-17 | Disposition: A | Discharge status: Home / Self Care | Payer: Self-pay | Source: Ambulatory Visit | Attending: Gastroenterology | Admitting: Gastroenterology

## 2015-01-17 DIAGNOSIS — R1111 Vomiting without nausea: Secondary | ICD-10-CM

## 2015-01-17 DIAGNOSIS — R112 Nausea with vomiting, unspecified: Secondary | ICD-10-CM | POA: Insufficient documentation

## 2015-01-17 DIAGNOSIS — R109 Unspecified abdominal pain: Secondary | ICD-10-CM | POA: Insufficient documentation

## 2015-01-17 DIAGNOSIS — R140 Abdominal distension (gaseous): Secondary | ICD-10-CM | POA: Insufficient documentation

## 2015-01-17 MED ORDER — TECHNETIUM TC 99M SULFUR COLLOID
2.0000 | Freq: Once | INTRAVENOUS | Status: AC | PRN
  Administered 2015-01-17: 2 via ORAL

## 2016-08-22 IMAGING — CT CT ENTEROGRAPHY (ABD-PELV W/ CM)
2 of 6 series · 17 of 46 positions shown, 19 images · IV contrast ([ID] VOLUMEN & [ID] OMNI 300)
Comparison: 03/23/2004

CLINICAL DATA: Right upper quadrant abdominal pain for 6 months.
Previous history small bowel obstruction.

EXAM:
CT ABDOMEN AND PELVIS WITH CONTRAST (ENTEROGRAPHY)
TECHNIQUE: Multidetector CT of the abdomen and pelvis during bolus
administration of intravenous contrast. Negative oral contrast
VoLumen was given.
CONTRAST:  100mL OMNIPAQUE IOHEXOL 300 MG/ML  SOLN

[Series 2: enterography (id) · axial · 0.74mm/px · z∈[-426,-36]mm · 14 of 180 slices shown, 16 images]
[im 12/180  soft-tissue]
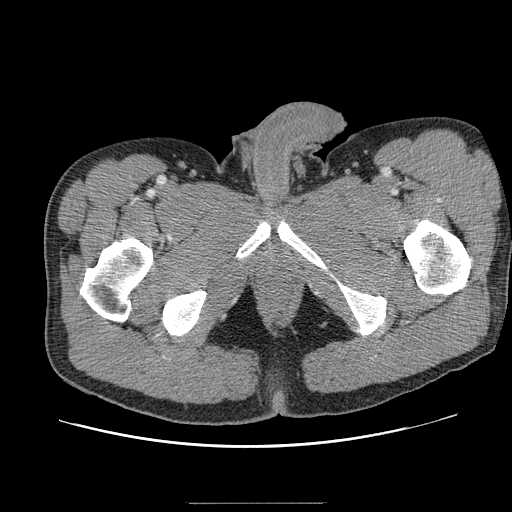
[im 12/180  bone]
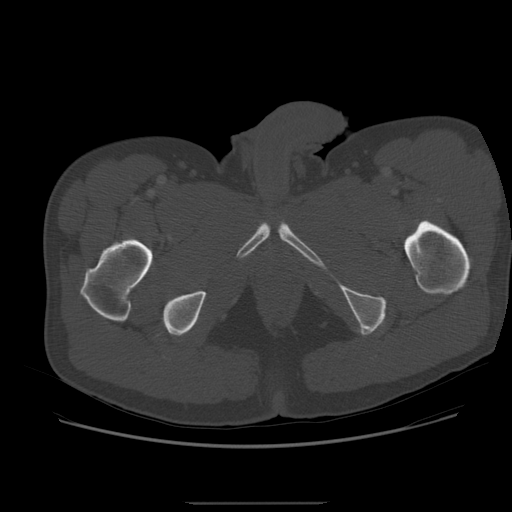
[im 23/180  soft-tissue]
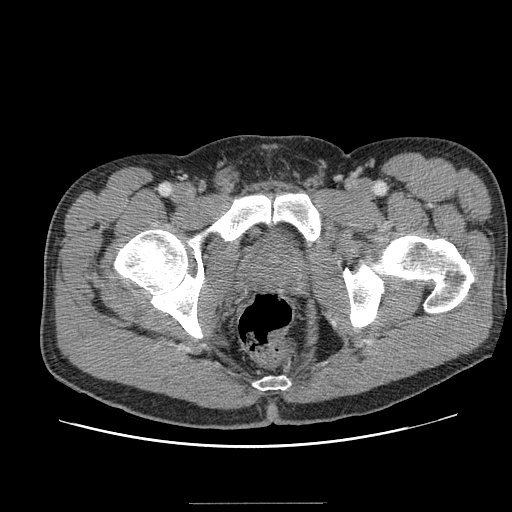
[im 34/180  soft-tissue]
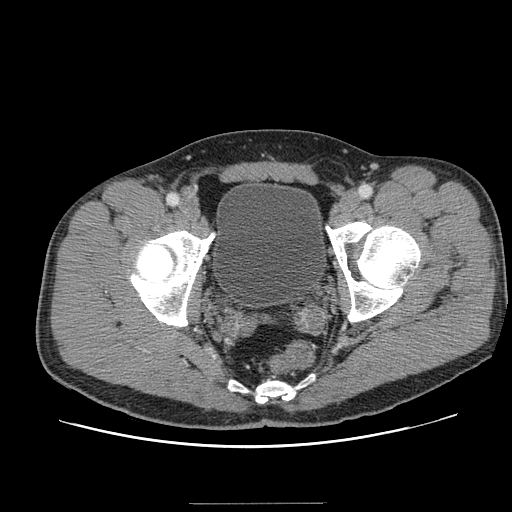
[im 45/180  soft-tissue]
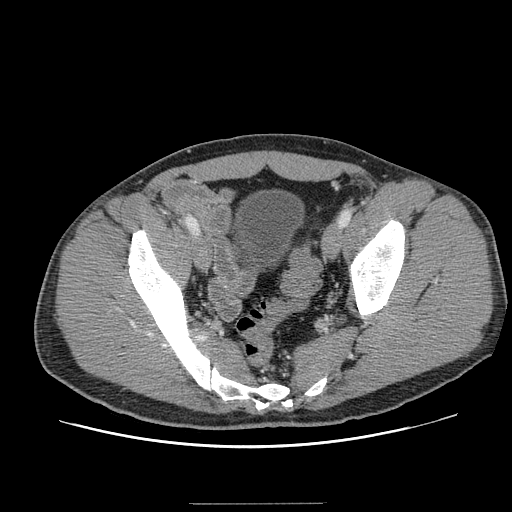
[im 56/180  soft-tissue]
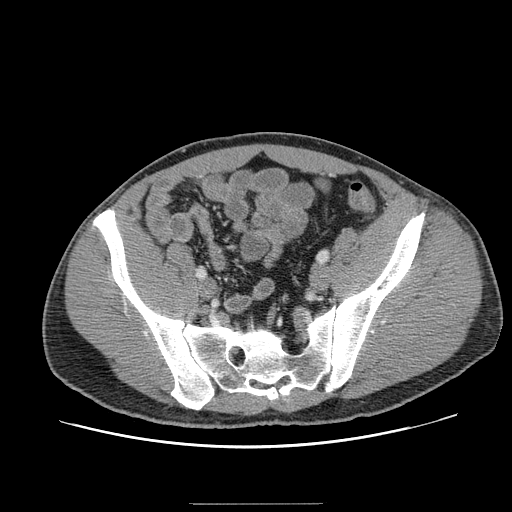
[im 68/180  soft-tissue]
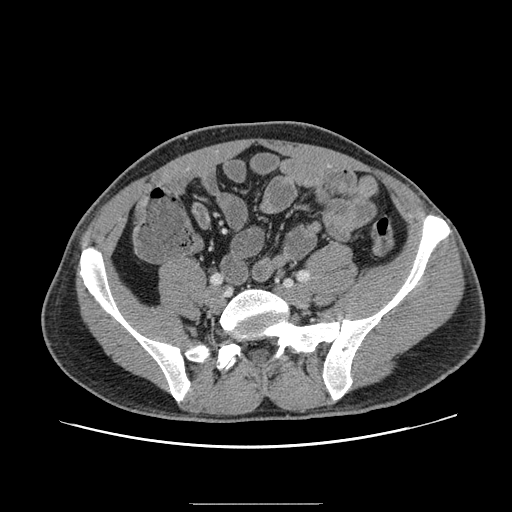
[im 79/180  soft-tissue]
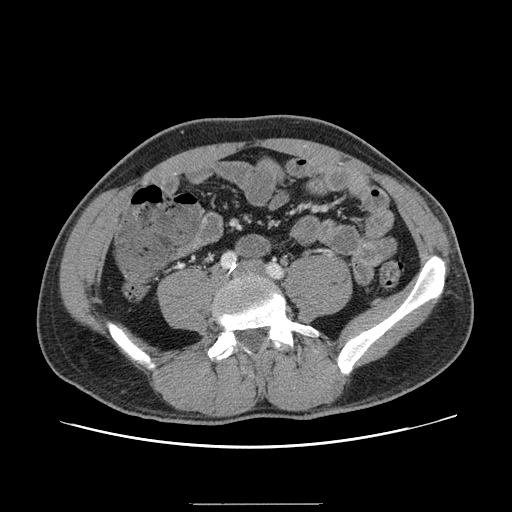
[im 101/180  soft-tissue]
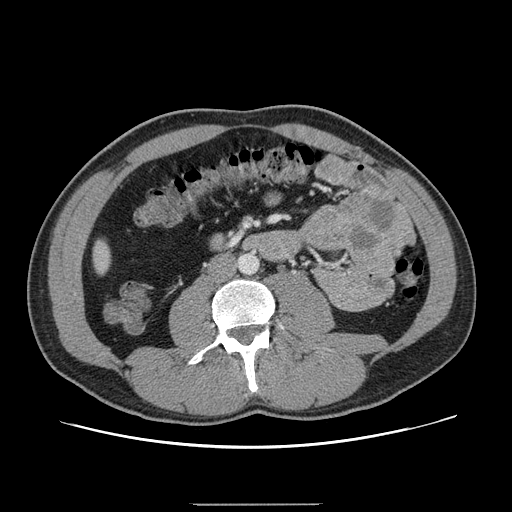
[im 112/180  soft-tissue]
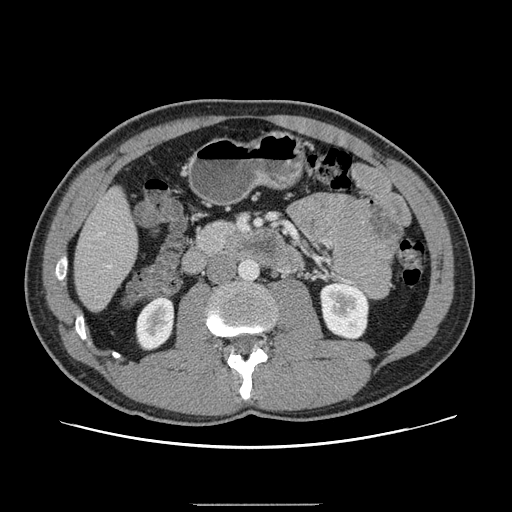
[im 112/180  bone]
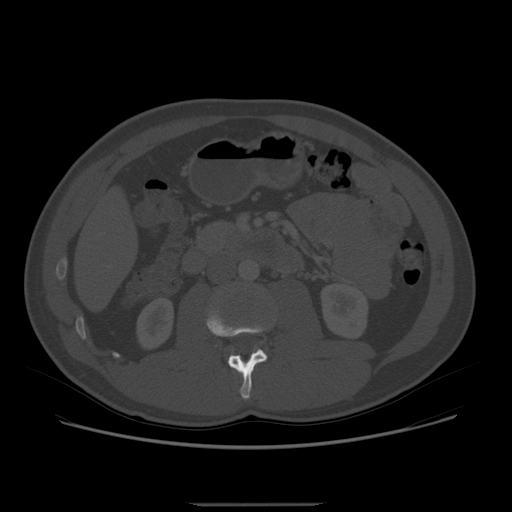
[im 124/180  soft-tissue]
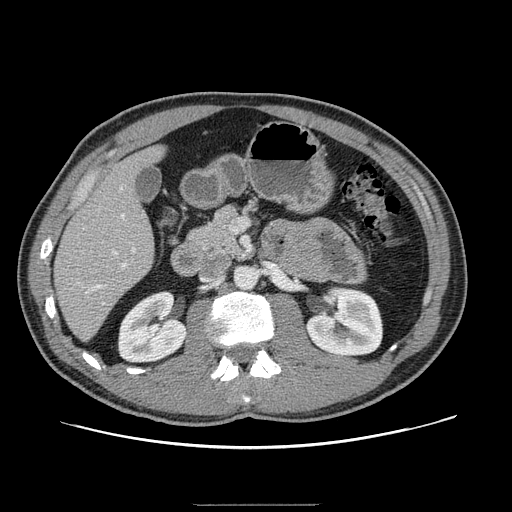
[im 135/180  soft-tissue]
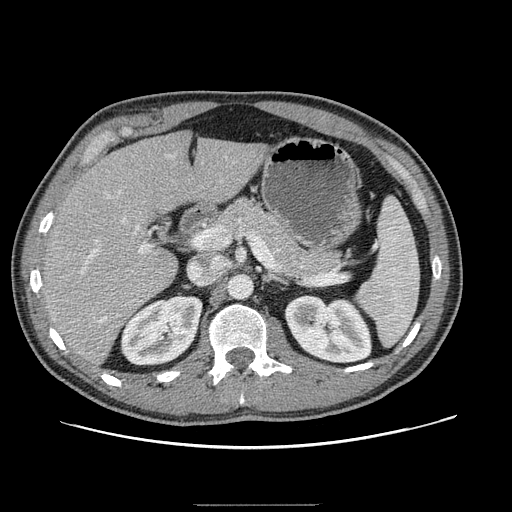
[im 146/180  soft-tissue]
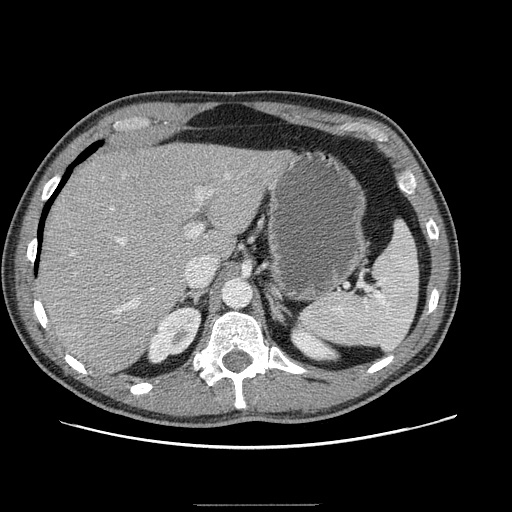
[im 157/180  soft-tissue]
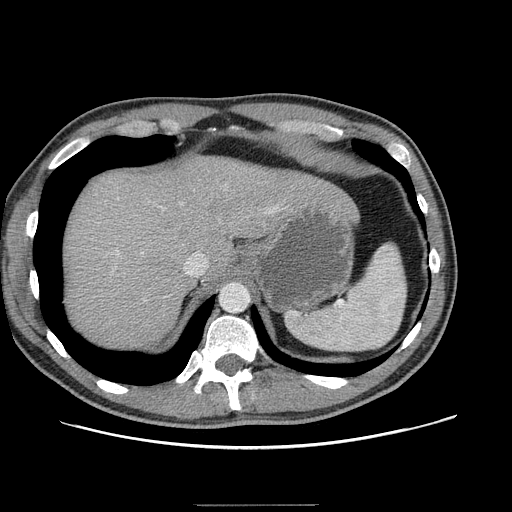
[im 168/180  soft-tissue]
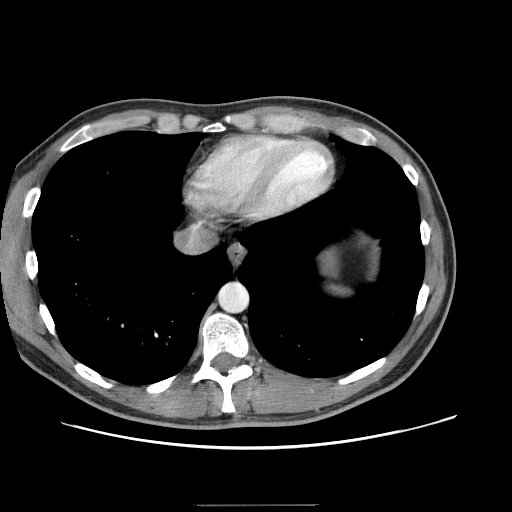

[Series 400: cor · coronal · 0.98mm/px · 3 of 134 slices shown]
[im 45/134  soft-tissue]
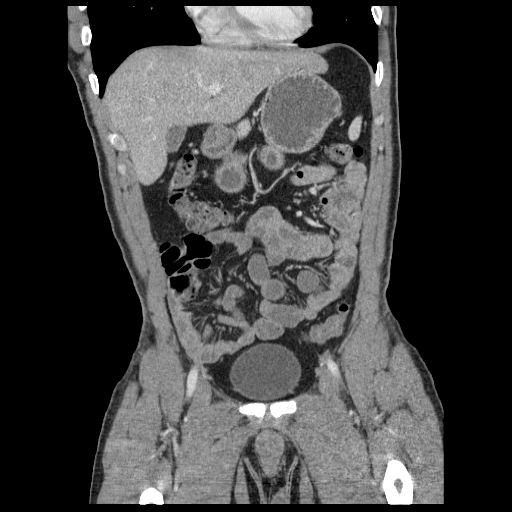
[im 60/134  soft-tissue]
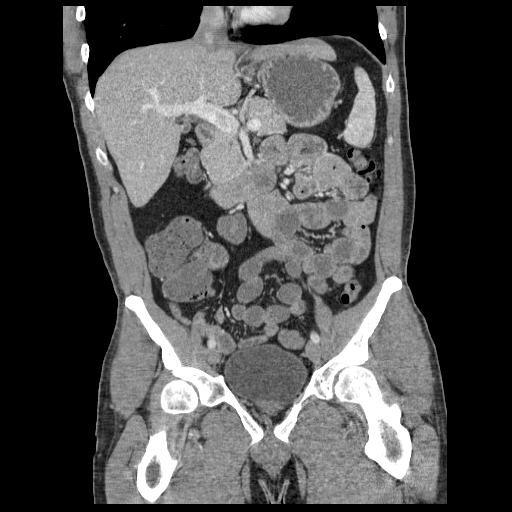
[im 74/134  soft-tissue]
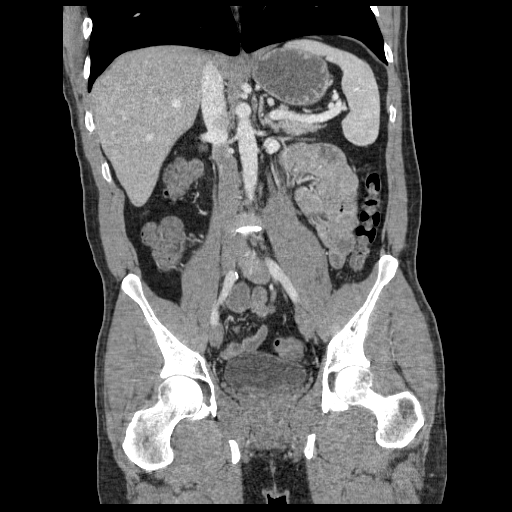

[17 of 46 positions shown; findings below may reference images not displayed]

FINDINGS: Lower chest:  Unremarkable.

Hepatobiliary:  No mass or other parenchymal abnormality identified.

Pancreas: No mass, inflammatory changes, or other parenchymal
abnormality identified.

Spleen:  Within normal limits in size and appearance.

Adrenal Glands:  No mass identified.

Kidneys/Urinary Tract: No masses identified. No evidence of
hydronephrosis.

Stomach/Bowel/Peritoneum: No evidence of small bowel wall thickening
or dilatation. No evidence of abnormal mucosal enhancement or
mesenteric inflammatory changes. No evidence of fistulization or
extraluminal gas or fluid collections. The terminal ileum is normal
in appearance. No other areas of bowel wall thickening identified.
Normal appendix visualized.

Vascular/Lymphatic: No pathologically enlarged lymph nodes
identified. No other significant abnormality identified.

Reproductive:  No mass or other significant abnormality identified.

Other:  None.

Musculoskeletal:  No suspicious bone lesions identified.
IMPRESSION: Negative. No radiographic evidence of inflammatory bowel disease or
other significant abnormality.

## 2021-03-15 ENCOUNTER — Other Ambulatory Visit: Payer: Self-pay | Admitting: Physician Assistant

## 2021-03-15 DIAGNOSIS — H903 Sensorineural hearing loss, bilateral: Secondary | ICD-10-CM

## 2021-03-30 ENCOUNTER — Other Ambulatory Visit: Payer: Self-pay

## 2021-11-30 DIAGNOSIS — Z8379 Family history of other diseases of the digestive system: Secondary | ICD-10-CM | POA: Diagnosis not present

## 2021-11-30 DIAGNOSIS — K219 Gastro-esophageal reflux disease without esophagitis: Secondary | ICD-10-CM | POA: Diagnosis not present

## 2022-07-26 DIAGNOSIS — H65112 Acute and subacute allergic otitis media (mucoid) (sanguinous) (serous), left ear: Secondary | ICD-10-CM | POA: Diagnosis not present

## 2023-01-01 DIAGNOSIS — J45998 Other asthma: Secondary | ICD-10-CM | POA: Diagnosis not present

## 2023-01-01 DIAGNOSIS — K641 Second degree hemorrhoids: Secondary | ICD-10-CM | POA: Diagnosis not present

## 2023-01-01 DIAGNOSIS — K219 Gastro-esophageal reflux disease without esophagitis: Secondary | ICD-10-CM | POA: Diagnosis not present

## 2023-03-11 DIAGNOSIS — R0789 Other chest pain: Secondary | ICD-10-CM | POA: Diagnosis not present

## 2023-03-12 DIAGNOSIS — R079 Chest pain, unspecified: Secondary | ICD-10-CM | POA: Diagnosis not present

## 2023-03-12 DIAGNOSIS — Z1330 Encounter for screening examination for mental health and behavioral disorders, unspecified: Secondary | ICD-10-CM | POA: Diagnosis not present

## 2023-03-12 DIAGNOSIS — R9431 Abnormal electrocardiogram [ECG] [EKG]: Secondary | ICD-10-CM | POA: Diagnosis not present

## 2023-04-01 DIAGNOSIS — R079 Chest pain, unspecified: Secondary | ICD-10-CM | POA: Diagnosis not present

## 2023-04-01 DIAGNOSIS — R9431 Abnormal electrocardiogram [ECG] [EKG]: Secondary | ICD-10-CM | POA: Diagnosis not present

## 2023-05-20 DIAGNOSIS — J0101 Acute recurrent maxillary sinusitis: Secondary | ICD-10-CM | POA: Diagnosis not present

## 2023-07-15 DIAGNOSIS — J0140 Acute pansinusitis, unspecified: Secondary | ICD-10-CM | POA: Diagnosis not present

## 2023-09-12 DIAGNOSIS — H6993 Unspecified Eustachian tube disorder, bilateral: Secondary | ICD-10-CM | POA: Diagnosis not present

## 2023-09-26 DIAGNOSIS — H8113 Benign paroxysmal vertigo, bilateral: Secondary | ICD-10-CM | POA: Diagnosis not present

## 2023-10-31 DIAGNOSIS — J3089 Other allergic rhinitis: Secondary | ICD-10-CM | POA: Diagnosis not present

## 2023-11-21 DIAGNOSIS — H6993 Unspecified Eustachian tube disorder, bilateral: Secondary | ICD-10-CM | POA: Diagnosis not present

## 2023-11-21 DIAGNOSIS — H8113 Benign paroxysmal vertigo, bilateral: Secondary | ICD-10-CM | POA: Diagnosis not present

## 2024-01-13 DIAGNOSIS — J0140 Acute pansinusitis, unspecified: Secondary | ICD-10-CM | POA: Diagnosis not present

## 2024-01-14 DIAGNOSIS — K219 Gastro-esophageal reflux disease without esophagitis: Secondary | ICD-10-CM | POA: Diagnosis not present

## 2024-01-14 DIAGNOSIS — Z1211 Encounter for screening for malignant neoplasm of colon: Secondary | ICD-10-CM | POA: Diagnosis not present

## 2024-02-11 ENCOUNTER — Ambulatory Visit (INDEPENDENT_AMBULATORY_CARE_PROVIDER_SITE_OTHER): Admitting: Audiology

## 2024-02-11 ENCOUNTER — Ambulatory Visit (INDEPENDENT_AMBULATORY_CARE_PROVIDER_SITE_OTHER): Admitting: Otolaryngology

## 2024-02-11 ENCOUNTER — Encounter (INDEPENDENT_AMBULATORY_CARE_PROVIDER_SITE_OTHER): Payer: Self-pay | Admitting: Otolaryngology

## 2024-02-11 VITALS — BP 147/93 | HR 73

## 2024-02-11 DIAGNOSIS — J310 Chronic rhinitis: Secondary | ICD-10-CM

## 2024-02-11 DIAGNOSIS — H900 Conductive hearing loss, bilateral: Secondary | ICD-10-CM

## 2024-02-11 DIAGNOSIS — R0981 Nasal congestion: Secondary | ICD-10-CM | POA: Diagnosis not present

## 2024-02-11 DIAGNOSIS — H6983 Other specified disorders of Eustachian tube, bilateral: Secondary | ICD-10-CM | POA: Diagnosis not present

## 2024-02-11 DIAGNOSIS — H9012 Conductive hearing loss, unilateral, left ear, with unrestricted hearing on the contralateral side: Secondary | ICD-10-CM

## 2024-02-11 DIAGNOSIS — R42 Dizziness and giddiness: Secondary | ICD-10-CM

## 2024-02-11 DIAGNOSIS — H90A12 Conductive hearing loss, unilateral, left ear with restricted hearing on the contralateral side: Secondary | ICD-10-CM

## 2024-02-11 DIAGNOSIS — H90A31 Mixed conductive and sensorineural hearing loss, unilateral, right ear with restricted hearing on the contralateral side: Secondary | ICD-10-CM

## 2024-02-11 DIAGNOSIS — H9071 Mixed conductive and sensorineural hearing loss, unilateral, right ear, with unrestricted hearing on the contralateral side: Secondary | ICD-10-CM

## 2024-02-11 NOTE — Progress Notes (Signed)
  [address], [address] [place], [place] [address] [phone]  Audiological Evaluation    Name: Earl Wells     DOB:   Jun 04, 1981      MRN:   [id number]                                                                                     Service Date: 02/11/2024     Accompanied by: wife   Patient comes today after Dr. Karis, ENT sent a referral for a hearing evaluation due to concerns with Eustachian tube dysfunction.   Symptoms Yes Details  Hearing loss  [x]  Both ears  Tinnitus  []    Ear pain/ infections/pressure  [x]  As a kid an eardrum ruptured (right), recalls having PE tubes as a child, then as an adult keeps having ear infections  Balance problems  []    Noise exposure history  [x]  Occupational, guns, and music  Previous ear surgeries  []    Family history of hearing loss  []    Amplification  []    Other  []      Otoscopy: Right ear: Clear external ear canal and notable landmarks visualized on the tympanic membrane. Left ear:  Clear external ear canal and notable landmarks visualized on the tympanic membrane.  Tympanometry: Right ear: Type A- Normal external ear canal volume with normal middle ear pressure and tympanic membrane compliance. Left ear: Type A- Normal external ear canal volume with normal middle ear pressure and tympanic membrane compliance.  Acoustic Reflexes Right ear: Absent ipsilaterally and contralaterally, except for a, ipsilateral response at 500 Hz when presented a 100 dBHL stimulus. Left ear: Absent ipsilaterally and contralaterally.  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 Left ear-  Moderate to moderately severe conductive hearing loss from 125 Hz - 8000 Hz.  Speech Audiometry: Right ear- Speech Reception Threshold (SRT) was obtained at 40 dBHL. Left ear-Speech Reception Threshold (SRT) was obtained at 50 dBHL.   Word Recognition Score Tested using NU-6 (recorded) Right ear: 92% was  obtained at a presentation level of 80 dBHL with contralateral masking which is deemed as  excellent. Left ear: 96% was obtained at a presentation level of 85 dBHL with contralateral masking which is deemed as  excellent.   The hearing test results were completed under headphones and re-checked with inserts and results are deemed to be of good to fair reliability. Test technique:  conventional      Recommendations: Follow up with ENT as scheduled for today. Repeat audiogram after medical care.   Gavriela Cashin MARIE LEROUX-MARTINEZ, AUD

## 2024-02-14 DIAGNOSIS — H6983 Other specified disorders of Eustachian tube, bilateral: Secondary | ICD-10-CM | POA: Insufficient documentation

## 2024-02-14 DIAGNOSIS — R42 Dizziness and giddiness: Secondary | ICD-10-CM | POA: Insufficient documentation

## 2024-02-14 DIAGNOSIS — J310 Chronic rhinitis: Secondary | ICD-10-CM | POA: Insufficient documentation

## 2024-02-14 DIAGNOSIS — H900 Conductive hearing loss, bilateral: Secondary | ICD-10-CM | POA: Insufficient documentation

## 2024-02-14 NOTE — Progress Notes (Signed)
 CC: Recurrent dizziness, recurrent ear infections, bilateral hearing loss  HPI:  Earl Wells is a 43 y.o. male who presents today complaining of bilateral hearing loss, recurrent ear infections, and recurrent dizziness.  According to the patient, he has been experiencing recurrent ear infections over the past few months.  He was treated with multiple antibiotics.  He also has a history of environmental allergies and chronic nasal congestion.  He currently uses Flonase and Allegra-D as needed.  The patient underwent bilateral myringotomy and tube placement as a child.  The tubes have since extruded.  He has noted bilateral hearing loss since his childhood.  The patient also complains of intermittent dizziness.  He describes the dizziness as a spinning vertigo.  His last episode lasted for 2 days.  He was treated with meclizine.  Past Medical History:  Diagnosis Date   Allergy to cats    uses Albuterol at bedtime   Anxiety    Complication of anesthesia    GERD (gastroesophageal reflux disease)    PONV (postoperative nausea and vomiting)    no problem with Colonoscopy.  Nausea with hand surgery as a teenager    Past Surgical History:  Procedure Laterality Date   CHOLECYSTECTOMY N/A 06/29/2014   Procedure: LAPAROSCOPIC CHOLECYSTECTOMY ;  Surgeon: Gordy Pina, MD;  Location: [facility] OR;  Service: General;  Laterality: N/A;   COLONOSCOPY     HAND SURGERY Right    pepple removed- infected    Family History  Problem Relation Age of Onset   Hypertension Mother    Hypertension Father    Heart attack Paternal Grandfather     Social History:  reports that he has been smoking. He has a 3 pack-year smoking history. He does not have any smokeless tobacco history on file. He reports current drug use. Drug: Marijuana. He reports that he does not drink alcohol.  Allergies:  Allergies  Allergen Reactions   Cefazolin Itching    Prior to Admission medications   Medication Sig Start Date End Date  Taking? Authorizing Provider  dexlansoprazole (DEXILANT) 60 MG capsule Take 60 mg by mouth daily.   Yes [provider]  fexofenadine (ALLEGRA) 180 MG tablet Take 180 mg by mouth daily.   Yes [provider]  fluticasone (FLONASE) 50 MCG/ACT nasal spray Place 1 spray into both nostrils daily. 12/18/23  Yes [provider]  albuterol (PROVENTIL HFA;VENTOLIN HFA) 108 (90 BASE) MCG/ACT inhaler Inhale 1-2 puffs into the lungs at bedtime. Patient not taking: Reported on 02/11/2024    [provider]  ALPRAZolam (XANAX) 0.25 MG tablet Take 0.125 mg by mouth 2 (two) times daily as needed for anxiety. Patient not taking: Reported on 02/11/2024    [provider]    Blood pressure (!) 147/93, pulse 73, SpO2 98%. Exam: General: Communicates without difficulty, well nourished, no acute distress. Head: Normocephalic, no evidence injury, no tenderness, facial buttresses intact without stepoff. Face/sinus: No tenderness to palpation and percussion. Facial movement is normal and symmetric. Eyes: PERRL, EOMI. No scleral icterus, conjunctivae clear. Neuro: CN II exam reveals vision grossly intact.  No nystagmus at any point of gaze. Ears: Auricles well formed without lesions.  Ear canals are intact without mass or lesion.  No erythema or edema is appreciated.  The TMs are intact without fluid. Nose: External evaluation reveals normal support and skin without lesions.  Dorsum is intact.  Anterior rhinoscopy reveals congested mucosa over anterior aspect of inferior turbinates and intact septum.  No purulence noted.  Oral:  Oral cavity and oropharynx are intact, symmetric, without erythema or edema.  Mucosa is moist without lesions. Neck: Full range of motion without pain.  There is no significant lymphadenopathy.  No masses palpable.  Thyroid bed within normal limits to palpation.  Parotid glands and submandibular glands equal bilaterally without mass.  Trachea is midline. Neuro:   CN 2-12 grossly intact. Vestibular: No nystagmus at any point of gaze. Dix Hallpike negative. Vestibular: There is no nystagmus with pneumatic pressure on either tympanic membrane or Valsalva. The cerebellar examination is unremarkable.   His hearing test shows bilateral conductive hearing loss.  Assessment: 1.  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  His ear canals, tympanic membranes, and middle ear spaces are otherwise normal. 2.  History of recurrent ear infections.  No acute infection is noted today. 3.  Chronic rhinitis with nasal mucosal congestion and bilateral eustachian tube dysfunction. 4.  Recurrent dizziness of unknown etiology. The possible differential diagnoses include transient BPPV, vestibular migraine, Meniere's disease, peripheral vestibular dysfunction, or other central/systemic causes.   Plan: 1.  The physical exam findings and the hearing test results are reviewed with the patient. 2.  The patient is reassured and no acute infection is noted today. 3.  Continue with Flonase nasal spray daily.  The importance of consistent daily use is discussed. 4.  Valsalva exercise multiple times a day. 5.  The patient is a candidate for hearing amplification.  Hearing aid options are discussed. 6.  The pathophysiology of vestibular dysfunction and dizziness are discussed extensively with the patient. The possible differential diagnoses are reviewed. Questions are invited and answered.  7.  The patient will return for reevaluation in 6 months, sooner if needed.  Stephaniemarie Stoffel W Yael Coppess 02/14/2024, 8:21 AM

## 2024-02-17 ENCOUNTER — Encounter: Payer: Self-pay | Admitting: Audiology

## 2024-06-01 DIAGNOSIS — J0140 Acute pansinusitis, unspecified: Secondary | ICD-10-CM | POA: Diagnosis not present

## 2024-08-21 ENCOUNTER — Ambulatory Visit (INDEPENDENT_AMBULATORY_CARE_PROVIDER_SITE_OTHER): Admitting: Otolaryngology
